# Patient Record
Sex: Male | Born: 1965 | Race: White | Hispanic: No | Marital: Married | Smoking: Never smoker
Health system: Southern US, Community
[De-identification: ages and names within clinical notes are randomized; demographics above are authoritative.]

## PROBLEM LIST (undated history)

## (undated) ENCOUNTER — Emergency Department (HOSPITAL_COMMUNITY): Payer: 59 | Source: Home / Self Care

## (undated) DIAGNOSIS — I10 Essential (primary) hypertension: Secondary | ICD-10-CM

## (undated) HISTORY — PX: BACK SURGERY: SHX140

## (undated) HISTORY — PX: TONSILLECTOMY: SUR1361

## (undated) HISTORY — DX: Essential (primary) hypertension: I10

---

## 2017-04-02 DIAGNOSIS — Z1211 Encounter for screening for malignant neoplasm of colon: Secondary | ICD-10-CM | POA: Diagnosis not present

## 2017-07-26 DIAGNOSIS — I10 Essential (primary) hypertension: Secondary | ICD-10-CM | POA: Diagnosis not present

## 2018-01-27 DIAGNOSIS — I10 Essential (primary) hypertension: Secondary | ICD-10-CM | POA: Diagnosis not present

## 2018-01-27 DIAGNOSIS — E669 Obesity, unspecified: Secondary | ICD-10-CM | POA: Diagnosis not present

## 2018-01-27 DIAGNOSIS — E7800 Pure hypercholesterolemia, unspecified: Secondary | ICD-10-CM | POA: Diagnosis not present

## 2018-01-27 DIAGNOSIS — Z125 Encounter for screening for malignant neoplasm of prostate: Secondary | ICD-10-CM | POA: Diagnosis not present

## 2018-05-28 ENCOUNTER — Encounter (HOSPITAL_COMMUNITY): Payer: Self-pay | Admitting: Emergency Medicine

## 2018-05-28 ENCOUNTER — Emergency Department (HOSPITAL_COMMUNITY)
Admission: EM | Admit: 2018-05-28 | Discharge: 2018-05-28 | Disposition: A | Discharge status: Home / Self Care | Payer: 59 | Attending: Emergency Medicine | Admitting: Emergency Medicine

## 2018-05-28 ENCOUNTER — Other Ambulatory Visit: Payer: Self-pay

## 2018-05-28 ENCOUNTER — Emergency Department (HOSPITAL_COMMUNITY): Payer: 59

## 2018-05-28 DIAGNOSIS — Y929 Unspecified place or not applicable: Secondary | ICD-10-CM | POA: Insufficient documentation

## 2018-05-28 DIAGNOSIS — S61212A Laceration without foreign body of right middle finger without damage to nail, initial encounter: Secondary | ICD-10-CM | POA: Diagnosis not present

## 2018-05-28 DIAGNOSIS — W312XXA Contact with powered woodworking and forming machines, initial encounter: Secondary | ICD-10-CM | POA: Insufficient documentation

## 2018-05-28 DIAGNOSIS — S61210A Laceration without foreign body of right index finger without damage to nail, initial encounter: Secondary | ICD-10-CM | POA: Diagnosis present

## 2018-05-28 DIAGNOSIS — Y999 Unspecified external cause status: Secondary | ICD-10-CM | POA: Diagnosis not present

## 2018-05-28 DIAGNOSIS — Y9389 Activity, other specified: Secondary | ICD-10-CM | POA: Insufficient documentation

## 2018-05-28 MED ORDER — LIDOCAINE HCL (PF) 2 % IJ SOLN: 5.0000 mL | Freq: Once | INTRAMUSCULAR | Status: DC

## 2018-05-28 MED ORDER — LIDOCAINE HCL (PF) 2 % IJ SOLN
INTRAMUSCULAR | Status: AC
  Filled 2018-05-28: qty 20

## 2018-05-28 MED ORDER — HYDROCODONE-ACETAMINOPHEN 5-325 MG PO TABS: 1.0000 | ORAL_TABLET | ORAL | 0 refills | Status: DC | PRN

## 2018-05-28 NOTE — ED Triage Notes (Signed)
RT middle and index finger laceration from router

## 2018-05-28 NOTE — Discharge Instructions (Addendum)
The index finger wound will need to heal/fill in given the avulsive nature of this wound. It appears that it should do this without problems but might need a skin graft (less likely).  Keep the wounds clean and dry.  Change the dressings once daily starting tomorrow evening.  Call Dr. Romeo Apple for  recheck this week as discussed.  Using ice and elevation will help with pain.  You may take the hydrocodone prescribed for pain relief.  This will make you drowsy - do not drive within 4 hours of taking this medication.

## 2018-05-29 NOTE — ED Provider Notes (Signed)
[facility] EMERGENCY DEPARTMENT Provider Note   CSN: [id number] Arrival date & time: 05/28/18  1235    History   Chief Complaint Chief Complaint  Patient presents with  . Laceration    HPI Alan Thomas is a 53 y.o. male.     The history is provided by the patient and the spouse.  Laceration  Location:  Finger Finger laceration location:  R index finger and R middle finger Length:  Various Depth:  Through dermis Quality: avulsion   Bleeding: controlled   Time since incident:  1 hour Injury mechanism: He was using a router to cut wood. Pain details:    Quality:  Dull   Progression:  Unchanged Foreign body present:  No foreign bodies Relieved by:  Pressure Ineffective treatments:  None tried Tetanus status:  Up to date Associated symptoms: no fever and no numbness     History reviewed. No pertinent past medical history.  There are no active problems to display for this patient.   Past Surgical History:  Procedure Laterality Date  . BACK SURGERY    . TONSILLECTOMY          Home Medications    Prior to Admission medications   Medication Sig Start Date End Date Taking? Authorizing Provider  HYDROcodone-acetaminophen (NORCO/VICODIN) 5-325 MG tablet Take 1 tablet by mouth every 4 (four) hours as needed. 05/28/18   Burgess Amor, PA-C    Family History History reviewed. No pertinent family history.  Social History Social History   Tobacco Use  . Smoking status: Never Smoker  . Smokeless tobacco: Never Used  Substance Use Topics  . Alcohol use: Never    Frequency: Never  . Drug use: Never     Allergies   Patient has no allergy information on record.   Review of Systems Review of Systems  Constitutional: Negative for chills and fever.  Musculoskeletal: Negative for arthralgias.  Skin: Positive for wound.  Neurological: Negative for numbness.  All other systems reviewed and are negative.    Physical Exam Updated Vital Signs BP 116/77 (BP  Location: Left Arm)   Pulse (!) 106   Temp 98 F (36.7 C) (Oral)   Resp 16   Ht 5\' 7"  (1.702 m)   Wt 99.8 kg   SpO2 94%   BMI 34.46 kg/m   Physical Exam Constitutional:      Appearance: He is well-developed.  HENT:     Head: Normocephalic.  Cardiovascular:     Rate and Rhythm: Normal rate.  Pulmonary:     Effort: Pulmonary effort is normal.  Musculoskeletal:        General: Tenderness present.     Right hand: He exhibits tenderness.     Comments: ttp right index and long fingertips.   Skin:    Findings: Laceration present.     Comments: Right long finger - distal volar finger tuft:  Superficial lacerations, few small flaps, longest wound 0.5cm, mostly involving epidermis only.   Right index finger - also distal volar tuft: 0.5 cm mostly avulsion injury through dermis.  No deep structures visualized.   Neurological:     Mental Status: He is alert and oriented to person, place, and time.     Sensory: No sensory deficit.      ED Treatments / Results  Labs (all labs ordered are listed, but only abnormal results are displayed) Labs Reviewed - No data to display  EKG None  Radiology Dg Hand Complete Right  Result Date: 05/28/2018 CLINICAL  DATA:  Saw injury and finger lacerations. EXAM: RIGHT HAND - COMPLETE 3+ VIEW COMPARISON:  None. FINDINGS: Soft tissue injury and laceration involving the tip of the index finger. Negative for fracture or dislocation in the left hand. Minimal osteophytosis and degenerative changes at the index and middle finger DIP joints. IMPRESSION: No acute bone abnormality in the right hand. Electronically Signed   By: Richarda Overlie M.D.   On: 05/28/2018 14:18    Procedures Procedures (including critical care time)  LACERATION REPAIR  Index finger Performed by: Burgess Amor Authorized by: Burgess Amor Consent: Verbal consent obtained. Risks and benefits: risks, benefits and alternatives were discussed Consent given by: patient Patient identity  confirmed: provided demographic data Prepped and Draped in normal sterile fashion Wound explored  Laceration Location: right index finger  Laceration Length: 0.5 cm  No Foreign Bodies seen or palpated  Anesthesia: digital block  Local anesthetic: lidocaine 2% without epinephrine  Anesthetic total: 2 ml  Irrigation method: syringe Amount of cleaning: standard  Skin closure: ethilon 4-0  Number of sutures: 1  Technique: simple interrupted.  The avulsed portion of the wound covered with xeroform and bulky dressing applied.   Patient tolerance: Patient tolerated the procedure well with no immediate complications.  LACERATION REPAIR right long finger Performed by: Burgess Amor Authorized by: Burgess Amor Consent: Verbal consent obtained. Risks and benefits: risks, benefits and alternatives were discussed Consent given by: patient Patient identity confirmed: provided demographic data Prepped and Draped in normal sterile fashion Wound explored  Laceration Location: right long finger  Laceration Length: 1cm , largest wound volar tuft, multiple small superficial lacerations.  No Foreign Bodies seen or palpated  Anesthesia: digitial block Local anesthetic: lidocaine 2% without epinephrine  Anesthetic total: 2 ml  Irrigation method: syringe Amount of cleaning: standard  Skin closure: sterile strips  Number of sutures: 3  Technique: sterile strips  Patient tolerance: Patient tolerated the procedure well with no immediate complications.    Medications Ordered in ED Medications - No data to display   Initial Impression / Assessment and Plan / ED Course  I have reviewed the triage vital signs and the nursing notes.  Pertinent labs & imaging results that were available during my care of the patient were reviewed by me and considered in my medical decision making (see chart for details).        Imaging reviewed, negative fractures. Wounds explored and no fb.   Fingers soaked in betadine and saline prior to repair.  Advised that the avulsion will need to heal and should, doubt need for skin grafting, but will refer to hand specialist for recheck this week.  Home care instructions given, xeroform given for bandage changes.  Pt is utd with tetanus.  Final Clinical Impressions(s) / ED Diagnoses   Final diagnoses:  Laceration of right index finger without foreign body without damage to nail, initial encounter  Laceration of right middle finger without foreign body without damage to nail, initial encounter    ED Discharge Orders         Ordered    HYDROcodone-acetaminophen (NORCO/VICODIN) 5-325 MG tablet  Every 4 hours PRN     05/28/18 1528           Burgess Amor, PA-C 05/29/18 0865    Raeford Razor, MD 05/29/18 1103

## 2018-05-30 ENCOUNTER — Encounter: Payer: Self-pay | Admitting: Orthopedic Surgery

## 2018-05-30 ENCOUNTER — Ambulatory Visit (INDEPENDENT_AMBULATORY_CARE_PROVIDER_SITE_OTHER): Payer: 59 | Admitting: Orthopedic Surgery

## 2018-05-30 VITALS — BP 136/95 | HR 105 | Ht 67.0 in | Wt 220.0 lb

## 2018-05-30 DIAGNOSIS — S61314A Laceration without foreign body of right ring finger with damage to nail, initial encounter: Secondary | ICD-10-CM | POA: Diagnosis not present

## 2018-05-30 NOTE — Progress Notes (Signed)
  NEW PATIENT OFFICE VISIT  Chief Complaint  Patient presents with  . Hand Injury    right index and middle finger injury     53 year old male injured his right index and right middle finger he is an avulsion volar on the ring and then a little laceration which was treated with Steri-Strips on the other digit.  No sensory loss.   Review of Systems  Neurological: Negative for tingling and sensory change.     Past Medical History:  Diagnosis Date  . Hypertension     Past Surgical History:  Procedure Laterality Date  . BACK SURGERY    . TONSILLECTOMY      Family History  Problem Relation Age of Onset  . Healthy Mother   . Diabetes Father   . Heart disease Father    Social History   Tobacco Use  . Smoking status: Never Smoker  . Smokeless tobacco: Never Used  Substance Use Topics  . Alcohol use: Never    Frequency: Never  . Drug use: Never    Not on File  Current Meds  Medication Sig  . HYDROcodone-acetaminophen (NORCO/VICODIN) 5-325 MG tablet Take 1 tablet by mouth every 4 (four) hours as needed.  Marland Kitchen lisinopril (PRINIVIL,ZESTRIL) 10 MG tablet Take 10 mg by mouth daily.    BP (!) 136/95   Pulse (!) 105   Ht 5\' 7"  (1.702 m)   Wt 220 lb (99.8 kg)   BMI 34.46 kg/m   Physical Exam Vitals signs reviewed.  Constitutional:      Appearance: Normal appearance. He is well-developed.  Skin:    General: Skin is warm and dry.     Findings: No erythema.  Neurological:     Mental Status: He is alert and oriented to person, place, and time.  Psychiatric:        Mood and Affect: Mood and affect normal.     Ortho Exam Avulsion of the fingertip small amount of tissue approximately 4 to 5 mm in length by 3 mm with and then a small laceration on the long finger.  Neurovascularly intact IP joint motion normal flexor tendon strength normal in both digits   MEDICAL DECISION SECTION  Xrays were done at [facility]  My independent reading of xrays:  CLINICAL DATA:  Saw  injury and finger lacerations.   EXAM: RIGHT HAND - COMPLETE 3+ VIEW   COMPARISON:  None.   FINDINGS: Soft tissue injury and laceration involving the tip of the index finger. Negative for fracture or dislocation in the left hand. Minimal osteophytosis and degenerative changes at the index and middle finger DIP joints.   IMPRESSION: No acute bone abnormality in the right hand.     Electronically Signed   By: Richarda Overlie M.D.   On: 05/28/2018 14:18  Encounter Diagnosis  Name Primary?  . Laceration of right ring finger without foreign body with damage to nail, initial encounter Yes    PLAN: (Rx., injectx, surgery, frx, mri/ct) Tube gauze dressing for 1 week then come back for dressing change suture removal and then will start daily dressing changes call us if any problems arise  No orders of the defined types were placed in this encounter.   Fuller Canada, MD  05/30/2018 4:35 PM

## 2018-06-06 ENCOUNTER — Other Ambulatory Visit: Payer: Self-pay

## 2018-06-06 ENCOUNTER — Ambulatory Visit (INDEPENDENT_AMBULATORY_CARE_PROVIDER_SITE_OTHER): Payer: 59 | Admitting: Orthopedic Surgery

## 2018-06-06 ENCOUNTER — Encounter: Payer: Self-pay | Admitting: Orthopedic Surgery

## 2018-06-06 VITALS — BP 125/80 | HR 94 | Ht 67.0 in | Wt 225.0 lb

## 2018-06-06 DIAGNOSIS — S61314A Laceration without foreign body of right ring finger with damage to nail, initial encounter: Secondary | ICD-10-CM | POA: Diagnosis not present

## 2018-06-06 NOTE — Patient Instructions (Signed)
Continue Neosporin and Band-Aid follow-up as needed

## 2018-06-06 NOTE — Progress Notes (Signed)
Chief Complaint  Patient presents with  . Hand Pain    right index finger injury 05/28/18    53 year old male who injured the volar aspect the right index finger with a soft tissue injury  He says it hurts if he bumps into something but otherwise doing well  It looks great it is granulating in nicely he can continue Neosporin and a Band-Aid until it closes completely  Follow-up as needed

## 2018-08-01 DIAGNOSIS — E7800 Pure hypercholesterolemia, unspecified: Secondary | ICD-10-CM | POA: Diagnosis not present

## 2018-08-01 DIAGNOSIS — I10 Essential (primary) hypertension: Secondary | ICD-10-CM | POA: Diagnosis not present

## 2018-08-01 DIAGNOSIS — E669 Obesity, unspecified: Secondary | ICD-10-CM | POA: Diagnosis not present

## 2019-07-10 ENCOUNTER — Other Ambulatory Visit: Payer: Self-pay

## 2019-07-10 ENCOUNTER — Ambulatory Visit (INDEPENDENT_AMBULATORY_CARE_PROVIDER_SITE_OTHER): Payer: 59

## 2019-07-10 ENCOUNTER — Ambulatory Visit (INDEPENDENT_AMBULATORY_CARE_PROVIDER_SITE_OTHER): Payer: 59 | Admitting: Podiatry

## 2019-07-10 ENCOUNTER — Encounter: Payer: Self-pay | Admitting: Podiatry

## 2019-07-10 VITALS — BP 159/103 | HR 77 | Resp 16

## 2019-07-10 DIAGNOSIS — M21622 Bunionette of left foot: Secondary | ICD-10-CM

## 2019-07-10 DIAGNOSIS — L989 Disorder of the skin and subcutaneous tissue, unspecified: Secondary | ICD-10-CM

## 2019-07-12 NOTE — Progress Notes (Signed)
   Subjective: 54 y.o. male presenting today as a new patient with a chief complaint of sharp pain to the lateral left foot at the 5th MPJ that has been ongoing for the past 2 years. He reports associated redness, swelling and callused skin of the area. Standing and wearing shoes increases the pain. He has been seen by Dr. Elijah Birk and received injections and had the callused skin trimmed, both providing temporary relief. Patient is here for further evaluation and treatment.   Past Medical History:  Diagnosis Date  . Hypertension      Objective: Physical Exam General: The patient is alert and oriented x3 in no acute distress.  Dermatology: Skin is cool, dry and supple bilateral lower extremities. Negative for open lesions or macerations.  Vascular: Palpable pedal pulses bilaterally. No edema or erythema noted. Capillary refill within normal limits.  Neurological: Epicritic and protective threshold grossly intact bilaterally.   Musculoskeletal Exam: Clinical evidence of Tailor's bunion deformity noted to the respective foot. There is a moderate pain on palpation range of motion of the fifth MPJ.   Radiographic Exam: Increased intermetatarsal angle to the fourth interspace of the respective foot. Prominent fifth metatarsal head. Joint spaces preserved.   Assessment: 1. 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 Patient was evaluated. X-Rays reviewed.  2. Excisional debridement of keratotic lesion(s) using a chisel blade was performed without incident. Light dressing applied.  3. Recommended OTC corn and callus remover.  4. Patient wants surgery this fall.  5. Return to clinic this fall for surgical consult.     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   Felecia Shelling, DPM [facility]  Dr. Felecia Shelling, DPM    [address]                                        [place], [place] [address]                Office [phone]  Fax [phone]

## 2019-11-01 ENCOUNTER — Other Ambulatory Visit: Payer: Self-pay

## 2019-11-01 ENCOUNTER — Ambulatory Visit (INDEPENDENT_AMBULATORY_CARE_PROVIDER_SITE_OTHER): Payer: 59 | Admitting: Podiatry

## 2019-11-01 DIAGNOSIS — M21622 Bunionette of left foot: Secondary | ICD-10-CM | POA: Diagnosis not present

## 2019-11-01 DIAGNOSIS — L989 Disorder of the skin and subcutaneous tissue, unspecified: Secondary | ICD-10-CM | POA: Diagnosis not present

## 2019-11-01 DIAGNOSIS — M7671 Peroneal tendinitis, right leg: Secondary | ICD-10-CM | POA: Diagnosis not present

## 2019-11-01 MED ORDER — MELOXICAM 15 MG PO TABS: 15.0000 mg | ORAL_TABLET | Freq: Every day | ORAL | 1 refills | Status: DC

## 2019-11-01 NOTE — Progress Notes (Signed)
   Subjective: 54 y.o. male presenting today for follow-up evaluation of a tailor's bunion deformity to the left foot with an overlying porokeratosis. Patient continues to have pain and sensitivity to the area. He is ready to have surgery performed in November. He presents today for surgical consult. Patient also has a new complaint regarding right foot pain. He says that he has some pain to the outside of the right foot that is been going on approximately 2 months now. He has not done anything for treatment. He denies injury. He is here for further evaluation and treatment  Past Medical History:  Diagnosis Date  . Hypertension      Objective: Physical Exam General: The patient is alert and oriented x3 in no acute distress.  Dermatology: Skin is cool, dry and supple bilateral lower extremities. Negative for open lesions or macerations.  Vascular: Palpable pedal pulses bilaterally. No edema or erythema noted. Capillary refill within normal limits.  Neurological: Epicritic and protective threshold grossly intact bilaterally.   Musculoskeletal Exam: Clinical evidence of Tailor's bunion deformity noted to the respective foot. There is a moderate pain on palpation range of motion of the fifth MPJ. There is some pain to palpation along the peroneal tendon as it traverses under the fifth metatarsal tubercle into the foot. Findings consistent with a peroneal tendinitis  Assessment: 1. 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 Peroneal tendinitis right   Plan of Care:  1. Patient was evaluated.  2. Excisional debridement of keratotic lesion(s) using a chisel blade was performed without incident. Light dressing applied.  3. Today we discussed the conservative versus surgical management of the presenting pathology. The patient opts for surgical management. All possible complications and details of the procedure were explained. All patient questions were answered. No  guarantees were expressed or implied. 4. Authorization for surgery was initiated today. Surgery will consist of tailor's bunionectomy with osteotomy right 5. Prescription for meloxicam 15 mg daily to see if it addresses his peroneal tendinitis 6. 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   Felecia Shelling, DPM [facility]  Dr. Felecia Shelling, DPM    [address]                                        [place], [place] [address]                Office [phone]  Fax [phone]

## 2019-11-13 ENCOUNTER — Telehealth: Payer: Self-pay | Admitting: Podiatry

## 2019-11-13 NOTE — Telephone Encounter (Signed)
I need to schedule some sx in November. It would be best if I tried to call you back, so I'll keep trying. Thanks and have a good day.

## 2019-11-13 NOTE — Telephone Encounter (Signed)
I'm calling to schedule my sx with Dr. Logan Bores. I'd like to get it scheduled for Thursday, 11/04. Told pt he could go ahead and register online with [employer] for [facility]. Told him I could not give him a sx time but someone from the sx center would call a day before to let him know his sx time and what time to arrive for pre-op. Told pt to call us with any questions.

## 2020-01-05 ENCOUNTER — Telehealth: Payer: Self-pay

## 2020-01-05 NOTE — Telephone Encounter (Signed)
Alan Thomas called to cancel his surgery with Dr. Logan Bores for 01/25/2020. He stated his foot has not been hurting and he wants to hold off. He will call us if it starts hurting again. Left a message for Aram Beecham at [facility] of cancellation.

## 2020-01-07 ENCOUNTER — Other Ambulatory Visit: Payer: Self-pay | Admitting: Podiatry

## 2020-01-31 ENCOUNTER — Encounter: Payer: 59 | Admitting: Podiatry

## 2020-02-07 ENCOUNTER — Encounter: Payer: 59 | Admitting: Podiatry

## 2020-02-21 ENCOUNTER — Encounter: Payer: 59 | Admitting: Podiatry

## 2020-08-22 IMAGING — DX DG HAND COMPLETE 3+V*R*
3 series · 3 of 3 positions shown · non-contrast
Comparison: None.

CLINICAL DATA: Saw injury and finger lacerations.

EXAM:
RIGHT HAND - COMPLETE 3+ VIEW

[hand pa]
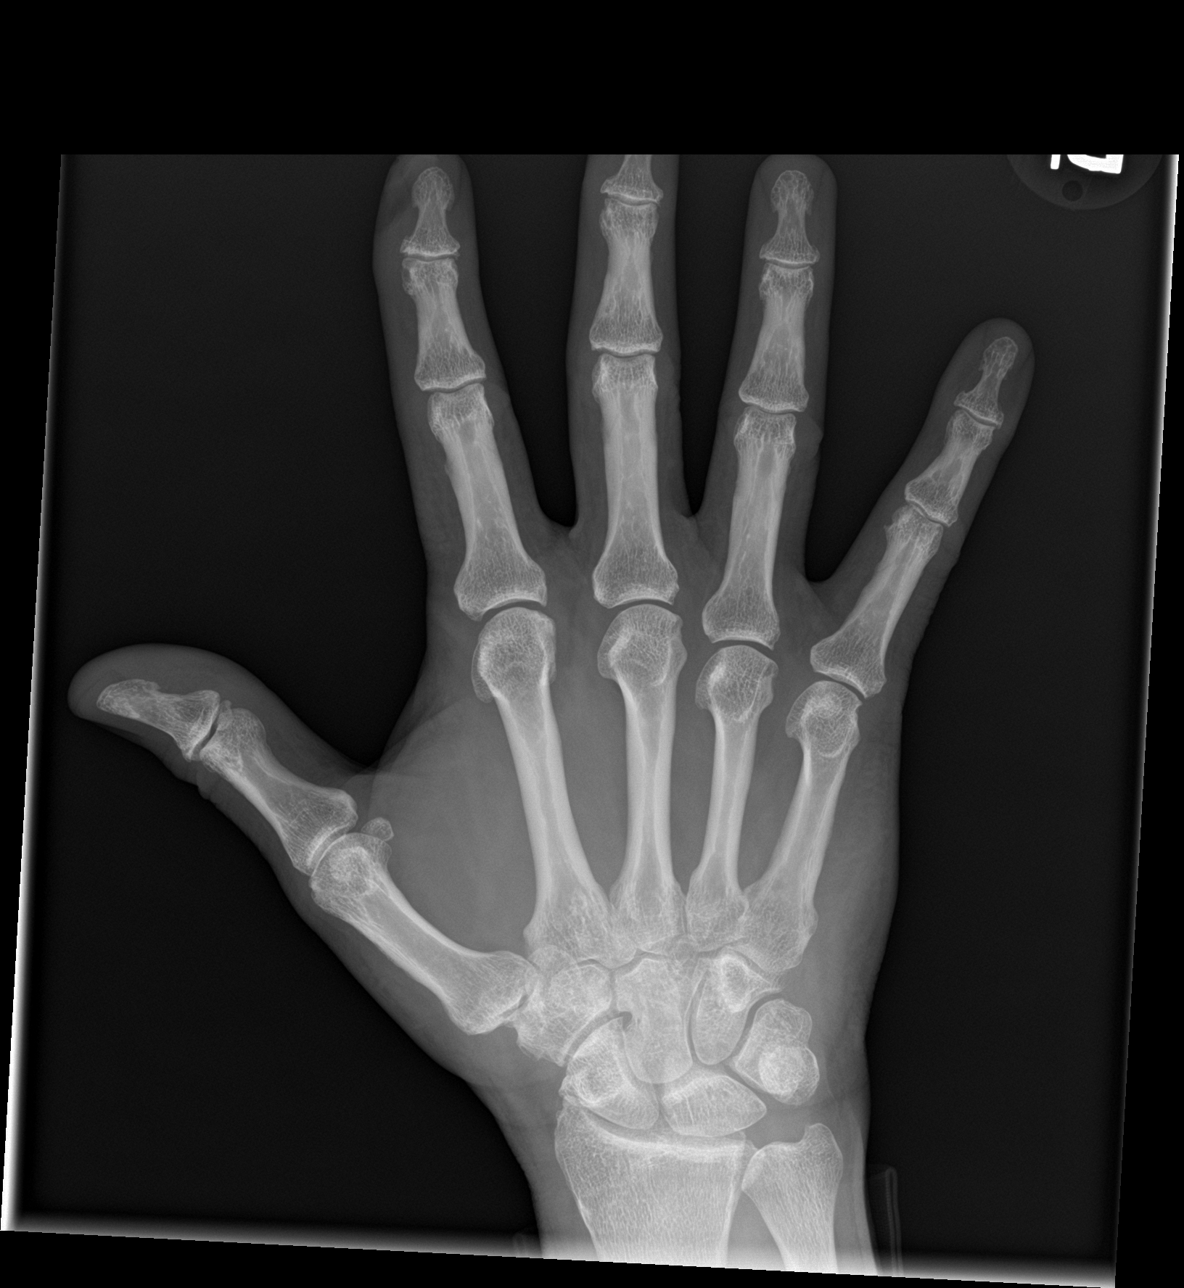

[hand obl]
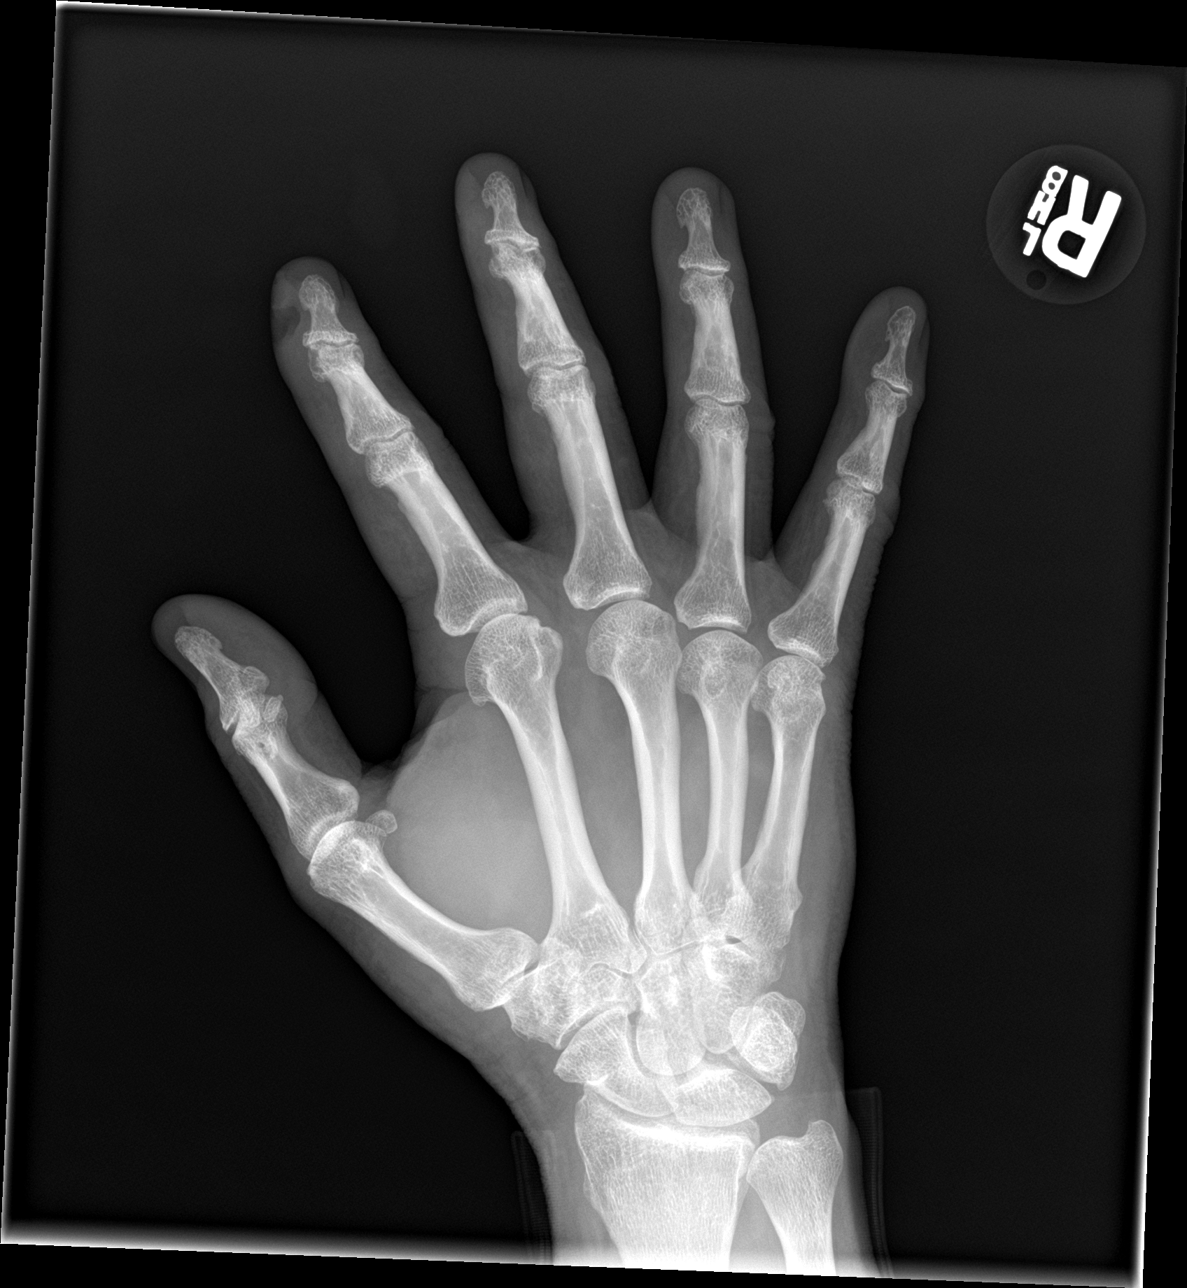

[hand lat]
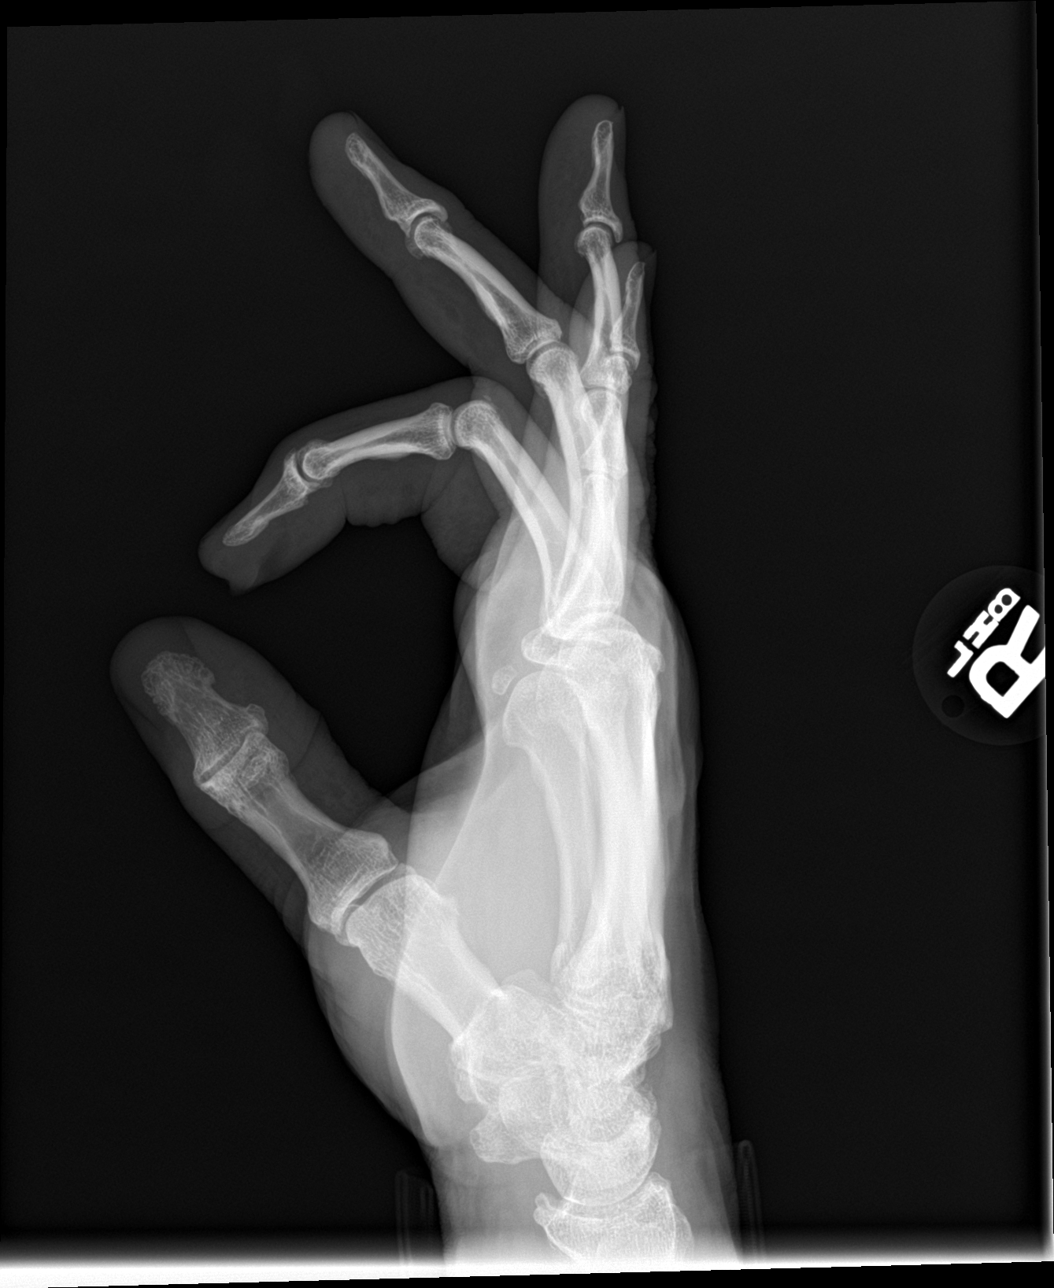

[3 of 3 positions shown; findings below may reference images not displayed]

FINDINGS: Soft tissue injury and laceration involving the tip of the index
finger. Negative for fracture or dislocation in the left hand.
Minimal osteophytosis and degenerative changes at the index and
middle finger DIP joints.
IMPRESSION: No acute bone abnormality in the right hand.

## 2021-09-08 ENCOUNTER — Ambulatory Visit (INDEPENDENT_AMBULATORY_CARE_PROVIDER_SITE_OTHER): Payer: 59 | Admitting: Podiatry

## 2021-09-08 DIAGNOSIS — L600 Ingrowing nail: Secondary | ICD-10-CM

## 2021-09-10 NOTE — Progress Notes (Signed)
   HPI: 56 y.o. male presenting today for new complaint of pain and tenderness to the right great toenail.  Patient states that he has a history of ingrown toenails.  He was last seen in the office on 11/01/2019 and we had scheduled to proceed with a tailor's bunionectomy procedure.  However after the visit his foot began to feel much better and surgery was canceled.  He states that intermittently for the last several years he has had ingrown toenails to the right foot.  Normally this has been managed by routine trimming of the nail.  Most recently he has had some pain and tenderness and presents for further treatment and evaluation  Past Medical History:  Diagnosis Date   Hypertension     Past Surgical History:  Procedure Laterality Date   BACK SURGERY     TONSILLECTOMY      No Known Allergies   Physical Exam: General: The patient is alert and oriented x3 in no acute distress.  Dermatology: Ingrowing nail noted to the right great toe without any clinical indication of infection.  Vascular: Palpable pedal pulses bilaterally. Capillary refill within normal limits.  Negative for any significant edema or erythema  Neurological: Light touch and protective threshold grossly intact  Musculoskeletal Exam: No pedal deformities noted   Assessment: 1.  Ingrown toenail right great toe   Plan of Care:  1. Patient evaluated.  2.  Today we discussed different treatment options including simple debridement versus partial nail matricectomy.  The patient would like to pursue more conservative management at this time. 3.  Mechanical debridement of the offending portion of nail was debrided using a nail nipper.  The patient did feel some relief 4.  Return to clinic as needed.  If the patient does not feel significant relief he can return to the office and we will proceed with partial nail matricectomy     Felecia Shelling, DPM [facility]  Dr. Felecia Shelling, DPM    [address]  [place], [place] [address]                Office [phone]  Fax [phone]

## 2022-10-27 DIAGNOSIS — R0602 Shortness of breath: Secondary | ICD-10-CM | POA: Insufficient documentation

## 2022-10-27 NOTE — Progress Notes (Unsigned)
Cardiology Office Note   Date:  10/28/2022   ID:  Dalynn Farag, DOB Jan 13, 1966, MRN [id number]  PCP:  Asencion Gowda.August Saucer, MD  Cardiologist:   None Referring:  Self  Chief Complaint  Patient presents with   Shortness of Breath      History of Present Illness: Alan Thomas is a 57 y.o. male who presents for evaluation of shortness of breath.  This has been slowly progressive over about a year.  He gets short of breath walking flights stairs.  He can keep going but it takes him a little while to recover when he sits down.  He is not describing any arm discomfort.  He might have a little tightness in his chest when he is short of breath.  He does not have any of the symptoms with rest.  He is not describing PND or orthopnea.  He had no new palpitations, presyncope or syncope.  His symptoms been slowly progressive.  He has a sedentary desk job.  He does not exercise routinely.  He said fairly recently he tore out a small pine tree in his yard and had to stop a few times and he thought that was unusual.  He had hypertension but has not taken his blood pressure medicines in years.  His lipids were as below and a year ago and he has not had them checked since February 2023.  He has never had any cardiac testing.   Past Medical History:  Diagnosis Date   Hypertension     Past Surgical History:  Procedure Laterality Date   BACK SURGERY     TONSILLECTOMY       Current Outpatient Medications  Medication Sig Dispense Refill   metoprolol tartrate (LOPRESSOR) 100 MG tablet Take 100 mg 2 hours before cardiac ct 1 tablet 0   rosuvastatin (CRESTOR) 20 MG tablet Take 1 tablet (20 mg total) by mouth daily. 90 tablet 3   valsartan (DIOVAN) 40 MG tablet Take 1 tablet (40 mg total) by mouth daily. 90 tablet 3   lisinopril (PRINIVIL,ZESTRIL) 10 MG tablet Take 10 mg by mouth daily. (Patient not taking: Reported on 10/28/2022)     meloxicam (MOBIC) 15 MG tablet TAKE 1 TABLET(15 MG) BY MOUTH DAILY (Patient  not taking: Reported on 10/28/2022) 30 tablet 1   No current facility-administered medications for this visit.    Allergies:   Patient has no known allergies.    Social History:  The patient  reports that he has never smoked. He has never used smokeless tobacco. He reports that he does not drink alcohol and does not use drugs.   Family History:  The patient's family history includes Diabetes in his father; Healthy in his mother; Heart disease (age of onset: 21) in his father.    ROS:  Please see the history of present illness.   Otherwise, review of systems are positive for none.   All other systems are reviewed and negative.    PHYSICAL EXAM: VS:  BP (!) 162/98 (BP Location: Left Arm, Patient Position: Sitting, Cuff Size: Normal)   Pulse 91   Ht 5\' 8"  (1.727 m)   Wt 236 lb 12.8 oz (107.4 kg)   SpO2 94%   BMI 36.01 kg/m  , BMI Body mass index is 36.01 kg/m. GENERAL:  Well appearing HEENT:  Pupils equal round and reactive, fundi not visualized, oral mucosa unremarkable NECK:  No jugular venous distention, waveform within normal limits, carotid upstroke brisk and symmetric, no bruits, no  thyromegaly LYMPHATICS:  No cervical, inguinal adenopathy LUNGS:  Clear to auscultation bilaterally BACK:  No CVA tenderness CHEST:  Unremarkable HEART:  PMI not displaced or sustained,S1 and S2 within normal limits, no S3, no S4, no clicks, no rubs, no murmurs ABD:  Flat, positive bowel sounds normal in frequency in pitch, no bruits, no rebound, no guarding, no midline pulsatile mass, no hepatomegaly, no splenomegaly EXT:  2 plus pulses throughout, no edema, no cyanosis no clubbing SKIN:  No rashes no nodules NEURO:  Cranial nerves II through XII grossly intact, motor grossly intact throughout [facility]:  Cognitively intact, oriented to person place and time    EKG:  EKG Interpretation Date/Time:  Wednesday October 28 2022 16:10:38 EDT Ventricular Rate:  80 PR Interval:  154 QRS Duration:  92 QT  Interval:  384 QTC Calculation: 442 R Axis:   59  Text Interpretation: Normal sinus rhythm Normal ECG No previous ECGs available Confirmed by Rollene Rotunda ([id number]) on 10/28/2022 4:59:04 PM     Recent Labs: No results found for requested labs within last 365 days.    Lipid Panel No results found for: "CHOL", "TRIG", "HDL", "CHOLHDL", "VLDL", "LDLCALC", "LDLDIRECT"    Wt Readings from Last 3 Encounters:  10/28/22 236 lb 12.8 oz (107.4 kg)  06/06/18 225 lb (102.1 kg)  05/30/18 220 lb (99.8 kg)     Other studies Reviewed: Additional studies/ records that were reviewed today include: Labs. Review of the above records demonstrates:  Please see elsewhere in the note.     ASSESSMENT AND PLAN:  SOB: The patient has significant cardiovascular risk factors.  He has shortness of breath which could certainly be an anginal equivalent.  The pretest probability of obstructive coronary disease is at least moderate.  He is limited by back pain right now and the other walk on a treadmill.  I am going to screen him with a coronary CTA.  Dyslipidemia I would like to start him on 20 mg daily.  In 3 months he should come back and get a lipid profile and LP(a).  We talked about a Mediterranean plant-based diet.  Hypertension: And start Diovan 40 mg daily.  We talked about diet and exercise.   Current medicines are reviewed at length with the patient today.  The patient does not have concerns regarding medicines.  The following changes have been made: As above  Labs/ tests ordered today include:   Orders Placed This Encounter  Procedures   CT CORONARY MORPH W/CTA COR W/SCORE W/CA W/CM &/OR WO/CM   Basic metabolic panel   Lipid panel   Lipoprotein A (LPA)   EKG 12-Lead     Disposition:   FU with me in 3 months or sooner based on the results of the above   Signed, Rollene Rotunda, MD  10/28/2022 5:16 PM    [facility]

## 2022-10-28 ENCOUNTER — Encounter: Payer: Self-pay | Admitting: Cardiology

## 2022-10-28 ENCOUNTER — Ambulatory Visit: Payer: 59 | Attending: Cardiology | Admitting: Cardiology

## 2022-10-28 VITALS — BP 162/98 | HR 91 | Ht 68.0 in | Wt 236.8 lb

## 2022-10-28 DIAGNOSIS — R0602 Shortness of breath: Secondary | ICD-10-CM

## 2022-10-28 DIAGNOSIS — Z136 Encounter for screening for cardiovascular disorders: Secondary | ICD-10-CM | POA: Diagnosis not present

## 2022-10-28 MED ORDER — VALSARTAN 40 MG PO TABS: 40.0000 mg | ORAL_TABLET | Freq: Every day | ORAL | 3 refills | Status: DC

## 2022-10-28 MED ORDER — METOPROLOL TARTRATE 100 MG PO TABS: ORAL_TABLET | ORAL | 0 refills | Status: DC

## 2022-10-28 MED ORDER — ROSUVASTATIN CALCIUM 20 MG PO TABS: 20.0000 mg | ORAL_TABLET | Freq: Every day | ORAL | 3 refills | Status: DC

## 2022-10-28 NOTE — Patient Instructions (Addendum)
Medication Instructions:  Start Valsartan 40 mg daily Start Crestor 20 mg daily Continue all other medications   Lab Work: Bmet to be done this week  Lipid panel and LPa to be done in 3 months   Testing/Procedures: Cardiac CT will be scheduled after approved by insurance     Follow instructions below   Follow-Up: At [facility], you and your health needs are our priority.  As part of our continuing mission to provide you with exceptional heart care, we have created designated Provider Care Teams.  These Care Teams include your primary Cardiologist (physician) and Advanced Practice Providers (APPs -  Physician Assistants and Nurse Practitioners) who all work together to provide you with the care you need, when you need it.  We recommend signing up for the patient portal called "MyChart".  Sign up information is provided on this After Visit Summary.  MyChart is used to connect with patients for Virtual Visits (Telemedicine).  Patients are able to view lab/test results, encounter notes, upcoming appointments, etc.  Non-urgent messages can be sent to your provider as well.   To learn more about what you can do with MyChart, go to [email or web address].    Your next appointment:  3 months    Provider:  Dr.Hochrein     Your cardiac CT will be scheduled at one of the below locations:   [facility] [address] [place], [place] [address] [phone]  OR  [facility] [address] [place], [place] [address] [phone]  OR   [facility] [address] [place], [place] [address] [phone]  If scheduled at [facility], please arrive at the [facility] and Children's Entrance (Entrance C2) of [facility] 30 minutes prior to test start time. You can use the FREE valet parking offered at entrance C (encouraged to control the heart rate for the test)  Proceed to  the [facility] Radiology Department (first floor) to check-in and test prep.  All radiology patients and guests should use entrance C2 at [facility], accessed from [facility], even though the hospital's physical address listed is [address]    If scheduled at [facility] or [facility], please arrive 15 mins early for check-in and test prep.  There is spacious parking and easy access to the radiology department from the [facility] Heart and Vascular entrance. Please enter here and check-in with the desk attendant.   Please follow these instructions carefully (unless otherwise directed):  An IV will be required for this test and Nitroglycerin will be given.  Hold all erectile dysfunction medications at least 3 days (72 hrs) prior to test. (Ie viagra, cialis, sildenafil, tadalafil, etc)   On the Night Before the Test: Be sure to Drink plenty of water. Do not consume any caffeinated/decaffeinated beverages or chocolate 12 hours prior to your test. Do not take any antihistamines 12 hours prior to your test.   On the Day of the Test: Drink plenty of water until 1 hour prior to the test. Do not eat any food 1 hour prior to test. You may take your regular medications prior to the test.  Take metoprolol 100 mg two hours prior to test. If you take Furosemide/Hydrochlorothiazide/Spironolactone, please HOLD on the morning of the test.         After the Test: Drink plenty of water. After receiving IV contrast, you may experience a  mild flushed feeling. This is normal. On occasion, you may experience a mild rash up to 24 hours after the test. This is not dangerous. If this occurs, you can take Benadryl 25 mg and increase your fluid intake. If you experience trouble breathing, this can be serious. If it is severe call 911 IMMEDIATELY. If it is mild, please call our office.   We will call to schedule your test 2-4  weeks out understanding that some insurance companies will need an authorization prior to the service being performed.   For more information and frequently asked questions, please visit our website : [email or web address]  For non-scheduling related questions, please contact the cardiac imaging nurse navigator should you have any questions/concerns: Cardiac Imaging Nurse Navigators Direct Office Dial: [phone]   For scheduling needs, including cancellations and rescheduling, please call [place], [phone].

## 2022-11-06 ENCOUNTER — Ambulatory Visit (HOSPITAL_COMMUNITY)
Admission: RE | Admit: 2022-11-06 | Discharge: 2022-11-06 | Disposition: A | Discharge status: Home / Self Care | Payer: 59 | Source: Ambulatory Visit | Attending: Cardiology | Admitting: Cardiology

## 2022-11-06 DIAGNOSIS — R0602 Shortness of breath: Secondary | ICD-10-CM | POA: Diagnosis not present

## 2022-11-06 DIAGNOSIS — Z136 Encounter for screening for cardiovascular disorders: Secondary | ICD-10-CM | POA: Insufficient documentation

## 2022-11-06 DIAGNOSIS — I209 Angina pectoris, unspecified: Secondary | ICD-10-CM | POA: Diagnosis not present

## 2022-11-06 MED ORDER — METOPROLOL TARTRATE 5 MG/5ML IV SOLN
INTRAVENOUS | Status: AC
  Filled 2022-11-06: qty 5

## 2022-11-06 MED ORDER — NITROGLYCERIN 0.4 MG SL SUBL
0.8000 mg | SUBLINGUAL_TABLET | Freq: Once | SUBLINGUAL | Status: AC
  Administered 2022-11-06: 0.8 mg via SUBLINGUAL

## 2022-11-06 MED ORDER — NITROGLYCERIN 0.4 MG SL SUBL
SUBLINGUAL_TABLET | SUBLINGUAL | Status: AC
  Filled 2022-11-06: qty 2

## 2022-11-06 MED ORDER — METOPROLOL TARTRATE 5 MG/5ML IV SOLN
5.0000 mg | Freq: Once | INTRAVENOUS | Status: AC
  Administered 2022-11-06: 5 mg via INTRAVENOUS

## 2022-11-06 MED ORDER — IOHEXOL 350 MG/ML SOLN
100.0000 mL | Freq: Once | INTRAVENOUS | Status: AC | PRN
  Administered 2022-11-06: 100 mL via INTRAVENOUS

## 2023-01-20 DIAGNOSIS — E785 Hyperlipidemia, unspecified: Secondary | ICD-10-CM | POA: Insufficient documentation

## 2023-01-20 DIAGNOSIS — I10 Essential (primary) hypertension: Secondary | ICD-10-CM | POA: Insufficient documentation

## 2023-01-20 DIAGNOSIS — R911 Solitary pulmonary nodule: Secondary | ICD-10-CM | POA: Insufficient documentation

## 2023-01-20 NOTE — Progress Notes (Signed)
  Cardiology Office Note:   Date:  01/22/2023  ID:  Alan Thomas, DOB 05-23-1965, MRN [id number] PCP: Asencion Gowda.August Saucer, MD (Inactive)  [facility] Providers Cardiologist:  Rollene Rotunda, MD {  History of Present Illness:   Alan Thomas is a 57 y.o. male who presents for evaluation of shortness of breath.  After the visit recently I sent him for a coronary CT.  This demonstrated 0 calcium and no coronary artery disease.  There was a subpleural 5 mm nodule.  He says that he has now been breathing better.  He is doing activity such as pushing a mower and is not getting any shortness of breath.  He denies any PND or orthopnea.  He said no palpitations, presyncope or syncope.  He is having chest pressure, neck or arm discomfort.  ROS: As stated in the HPI and negative for all other systems.  Studies Reviewed:    EKG:   NA  Risk Assessment/Calculations:              Physical Exam:   VS:  BP 122/82   Pulse 72   Ht 5\' 7"  (1.702 m)   Wt 228 lb 6.4 oz (103.6 kg)   SpO2 95%   BMI 35.77 kg/m    Wt Readings from Last 3 Encounters:  01/22/23 228 lb 6.4 oz (103.6 kg)  10/28/22 236 lb 12.8 oz (107.4 kg)  06/06/18 225 lb (102.1 kg)     GEN: Well nourished, well developed in no acute distress NECK: No JVD; No carotid bruits CARDIAC: RRR, no murmurs, rubs, gallops RESPIRATORY:  Clear to auscultation without rales, wheezing or rhonchi  ABDOMEN: Soft, non-tender, non-distended EXTREMITIES:  No edema; No deformity   ASSESSMENT AND PLAN:   SOB: This is improved.  No change in therapy.    Dyslipidemia : Given the fact that he has 0 coronary calcium and no plaque I think he can discontinue his Crestor but he needs to pursue diet control and can have this followed by his primary provider.    Hypertension:   His blood pressure is well-controlled on Diovan.  No change in therapy.  Lung nodule: I will follow with a CT in August 2025.         Follow up with me as needed.     Signed, Rollene Rotunda, MD

## 2023-01-21 LAB — LIPID PANEL
Chol/HDL Ratio: 3.1 {ratio} (ref 0.0–5.0)
Cholesterol, Total: 126 mg/dL (ref 100–199)
HDL: 41 mg/dL (ref >39)
LDL Chol Calc (NIH): 63 mg/dL (ref 0–99)
Triglycerides: 126 mg/dL (ref 0–149)
VLDL Cholesterol Cal: 22 mg/dL (ref 5–40)

## 2023-01-21 LAB — LIPOPROTEIN A (LPA): Lipoprotein (a): 33.3 nmol/L (ref <75.0)

## 2023-01-22 ENCOUNTER — Encounter: Payer: Self-pay | Admitting: Cardiology

## 2023-01-22 ENCOUNTER — Ambulatory Visit: Payer: 59 | Attending: Cardiology | Admitting: Cardiology

## 2023-01-22 VITALS — BP 122/82 | HR 72 | Ht 67.0 in | Wt 228.4 lb

## 2023-01-22 DIAGNOSIS — R911 Solitary pulmonary nodule: Secondary | ICD-10-CM

## 2023-01-22 DIAGNOSIS — E785 Hyperlipidemia, unspecified: Secondary | ICD-10-CM

## 2023-01-22 DIAGNOSIS — R0602 Shortness of breath: Secondary | ICD-10-CM

## 2023-01-22 DIAGNOSIS — I10 Essential (primary) hypertension: Secondary | ICD-10-CM

## 2023-01-22 NOTE — Patient Instructions (Addendum)
Medication Instructions:  Stop taking Crestor. Continue taking Valsartan. All other meds have been removed from med list per patient request.    Follow-Up: At [facility], you and your health needs are our priority.  As part of our continuing mission to provide you with exceptional heart care, we have created designated Provider Care Teams.  These Care Teams include your primary Cardiologist (physician) and Advanced Practice Providers (APPs -  Physician Assistants and Nurse Practitioners) who all work together to provide you with the care you need, when you need it.  We recommend signing up for the patient portal called "MyChart".  Sign up information is provided on this After Visit Summary.  MyChart is used to connect with patients for Virtual Visits (Telemedicine).  Patients are able to view lab/test results, encounter notes, upcoming appointments, etc.  Non-urgent messages can be sent to your provider as well.   To learn more about what you can do with MyChart, go to [email or web address].    Your next appointment:    As needed.   Provider:   Rollene Rotunda, MD    Non-Cardiac CT scanning, (CAT scanning), is a noninvasive, special x-ray that produces cross-sectional images of the body using x-rays and a computer. CT scans help physicians diagnose and treat medical conditions. For some CT exams, a contrast material is used to enhance visibility in the area of the body being studied. CT scans provide greater clarity and reveal more details than regular x-ray exams.  Scheduled for Friday November 8th at 6:15 pm at [facility].

## 2023-01-29 ENCOUNTER — Ambulatory Visit (HOSPITAL_COMMUNITY)
Admission: RE | Admit: 2023-01-29 | Discharge: 2023-01-29 | Disposition: A | Discharge status: Home / Self Care | Payer: 59 | Source: Ambulatory Visit | Attending: Cardiology | Admitting: Cardiology

## 2023-01-29 DIAGNOSIS — R911 Solitary pulmonary nodule: Secondary | ICD-10-CM | POA: Insufficient documentation

## 2023-02-04 ENCOUNTER — Encounter: Payer: Self-pay | Admitting: Cardiology

## 2023-02-23 ENCOUNTER — Other Ambulatory Visit: Payer: Self-pay | Admitting: *Deleted

## 2023-02-23 DIAGNOSIS — R911 Solitary pulmonary nodule: Secondary | ICD-10-CM

## 2023-09-04 ENCOUNTER — Other Ambulatory Visit: Payer: Self-pay | Admitting: Cardiology
